# Patient Record
Sex: Female | Born: 1980 | Race: Black or African American | Hispanic: No | Marital: Single | Smoking: Former smoker
Health system: Southern US, Community
[De-identification: ages and names within clinical notes are randomized; demographics above are authoritative.]

## PROBLEM LIST (undated history)

## (undated) DIAGNOSIS — E7800 Pure hypercholesterolemia, unspecified: Secondary | ICD-10-CM

## (undated) DIAGNOSIS — M7742 Metatarsalgia, left foot: Secondary | ICD-10-CM

## (undated) DIAGNOSIS — R42 Dizziness and giddiness: Secondary | ICD-10-CM

## (undated) DIAGNOSIS — I10 Essential (primary) hypertension: Secondary | ICD-10-CM

## (undated) DIAGNOSIS — E876 Hypokalemia: Secondary | ICD-10-CM

## (undated) DIAGNOSIS — R000 Tachycardia, unspecified: Secondary | ICD-10-CM

## (undated) DIAGNOSIS — M7741 Metatarsalgia, right foot: Secondary | ICD-10-CM

## (undated) HISTORY — DX: Essential (primary) hypertension: I10

## (undated) HISTORY — DX: Metatarsalgia, left foot: M77.42

## (undated) HISTORY — DX: Tachycardia, unspecified: R00.0

## (undated) HISTORY — DX: Metatarsalgia, right foot: M77.41

## (undated) HISTORY — DX: Pure hypercholesterolemia, unspecified: E78.00

## (undated) HISTORY — DX: Hypokalemia: E87.6

## (undated) HISTORY — DX: Dizziness and giddiness: R42

---

## 2006-06-26 ENCOUNTER — Ambulatory Visit: Payer: Self-pay | Admitting: Obstetrics & Gynecology

## 2006-06-26 ENCOUNTER — Other Ambulatory Visit: Admission: RE | Admit: 2006-06-26 | Discharge: 2006-06-26 | Payer: Self-pay | Admitting: Obstetrics & Gynecology

## 2006-06-27 ENCOUNTER — Encounter (INDEPENDENT_AMBULATORY_CARE_PROVIDER_SITE_OTHER): Payer: Self-pay | Admitting: Specialist

## 2006-08-28 ENCOUNTER — Ambulatory Visit: Payer: Self-pay | Admitting: Obstetrics & Gynecology

## 2015-11-10 ENCOUNTER — Encounter: Payer: Self-pay | Admitting: Podiatry

## 2015-12-11 DIAGNOSIS — M79672 Pain in left foot: Principal | ICD-10-CM

## 2015-12-11 DIAGNOSIS — M79671 Pain in right foot: Secondary | ICD-10-CM | POA: Insufficient documentation

## 2015-12-11 NOTE — Progress Notes (Signed)
No show

## 2016-01-05 ENCOUNTER — Emergency Department (HOSPITAL_BASED_OUTPATIENT_CLINIC_OR_DEPARTMENT_OTHER): Payer: Medicaid Other

## 2016-01-05 ENCOUNTER — Emergency Department (HOSPITAL_BASED_OUTPATIENT_CLINIC_OR_DEPARTMENT_OTHER)
Admission: EM | Admit: 2016-01-05 | Discharge: 2016-01-05 | Disposition: A | Discharge status: Home / Self Care | Payer: Medicaid Other | Attending: Emergency Medicine | Admitting: Emergency Medicine

## 2016-01-05 ENCOUNTER — Encounter (HOSPITAL_BASED_OUTPATIENT_CLINIC_OR_DEPARTMENT_OTHER): Payer: Self-pay | Admitting: Emergency Medicine

## 2016-01-05 DIAGNOSIS — R51 Headache: Secondary | ICD-10-CM | POA: Diagnosis present

## 2016-01-05 DIAGNOSIS — R42 Dizziness and giddiness: Secondary | ICD-10-CM | POA: Diagnosis not present

## 2016-01-05 DIAGNOSIS — G44219 Episodic tension-type headache, not intractable: Secondary | ICD-10-CM | POA: Insufficient documentation

## 2016-01-05 NOTE — ED Triage Notes (Signed)
Ha and dizziness x 2 weeks, no changes today just continues

## 2016-01-05 NOTE — ED Provider Notes (Signed)
[facility]   CSN: [id number] Arrival date & time: 01/05/16  1702  By signing my name below, I, Phillis HaggisGabriella Gaje, attest that this documentation has been prepared under the direction and in the presence of Felicie Mornavid Korben Carcione, NP-C. Electronically Signed: Phillis HaggisGabriella Gaje, ED Scribe. 01/05/16. 6:33 PM.  History   Chief Complaint Chief Complaint  Patient presents with  . Headache   The history is provided by the patient. No language interpreter was used.  Headache   This is a new problem. The current episode started more than 1 week ago. The problem has not changed since onset.The pain is moderate. The pain does not radiate. Pertinent negatives include no nausea and no vomiting. Treatments tried: Meclizine and Tramadol. The treatment provided mild relief.    HPI Comments: Donna Alexander is a 35 y.o. female who presents to the Emergency Department complaining of an unchanged, intermittent, persistent headache onset two weeks ago. Pt reports associated intermittent dizziness. Pt states that she was recently diagnosed with a sinus infection and vertigo when the pain began. Pt was started on Meclizine for the vertigo. She states that she has been taking the Meclizine and Tramadol mild relief but the symptoms will return. Pt is on Augmentin for her sinus infection. She denies nausea, vomiting, photophobia, numbness, or weakness.   History reviewed. No pertinent past medical history.  Patient Active Problem List   Diagnosis Date Noted  . Foot pain, bilateral 12/11/2015    History reviewed. No pertinent surgical history.  OB History    No data available       Home Medications    Prior to Admission medications   Not on File    Family History History reviewed. No pertinent family history.  Social History Social History  Substance Use Topics  . Smoking status: Never Smoker  . Smokeless tobacco: Never Used  . Alcohol use No     Allergies   Review of patient's  allergies indicates no known allergies.   Review of Systems Review of Systems  Eyes: Negative for photophobia.  Gastrointestinal: Negative for nausea and vomiting.  Neurological: Positive for dizziness and headaches. Negative for weakness and numbness.  All other systems reviewed and are negative.  Physical Exam Updated Vital Signs BP 137/100   Pulse 76   Temp 98.4 F (36.9 C) (Oral)   Resp 18   Ht 5\' 4"  (1.626 m)   Wt 236 lb (107 kg)   SpO2 100%   BMI 40.51 kg/m   Physical Exam  Constitutional: She is oriented to person, place, and time. She appears well-developed and well-nourished.  HENT:  Head: Normocephalic and atraumatic.  Eyes: EOM are normal. Pupils are equal, round, and reactive to light.  Neck: Normal range of motion. Neck supple.  Cardiovascular: Normal rate, regular rhythm and normal heart sounds.  Exam reveals no gallop and no friction rub.   No murmur heard. Pulmonary/Chest: Effort normal and breath sounds normal. She has no wheezes.  Abdominal: Soft. There is no tenderness.  Musculoskeletal: Normal range of motion.  Neurological: She is alert and oriented to person, place, and time. She has normal strength. No cranial nerve deficit. Coordination normal.  Skin: Skin is warm and dry.  Psychiatric: She has a normal mood and affect. Her behavior is normal.  Nursing note and vitals reviewed.    ED Treatments / Results  DIAGNOSTIC STUDIES: Oxygen Saturation is 100% on RA, normal by my interpretation.    COORDINATION OF CARE: 6:30 PM-Discussed treatment  plan which includes CT scan with pt at bedside and pt agreed to plan.    Labs (all labs ordered are listed, but only abnormal results are displayed) Labs Reviewed - No data to display  EKG  EKG Interpretation None       Radiology Ct Head Wo Contrast  Result Date: 01/05/2016 CLINICAL DATA:  35 y/o F; 2 weeks of headache with lightheadedness and dizziness. EXAM: CT HEAD WITHOUT CONTRAST TECHNIQUE:  Contiguous axial images were obtained from the base of the skull through the vertex without intravenous contrast. COMPARISON:  12/24/2015 CT of the head physical acute polyp is (pain FINDINGS: Brain: No evidence of acute infarction, hemorrhage, hydrocephalus, extra-axial collection or mass lesion/mass effect. Vascular: No hyperdense vessel or unexpected calcification. Skull: Normal. Negative for fracture or focal lesion. Sinuses/Orbits: Stable right maxillary sinus mucous retention cyst, otherwise normal. Other: None. IMPRESSION: No acute intracranial abnormality.  Normal CT of the head. Electronically Signed   By: Mitzi Hansen M.D.   On: 01/05/2016 19:05    Procedures Procedures (including critical care time)  Medications Ordered in ED Medications - No data to display   Initial Impression / Assessment and Plan / ED Course  I have reviewed the triage vital signs and the nursing notes.  Pertinent labs & imaging results that were available during my care of the patient were reviewed by me and considered in my medical decision making (see chart for details).  Clinical Course  CT results reviewed and shared with patient.  Presentation is non concerning for [facility], [facility], Meningitis, or temporal arteritis. Pt is afebrile with no focal neuro deficits, nuchal rigidity, or change in vision. Patient to continue with meclizine for dizziness. Pt verbalizes understanding and is agreeable with plan to dc.     Final Clinical Impressions(s) / ED Diagnoses   Final diagnoses:  None  I personally performed the services described in this documentation, which was scribed in my presence. The recorded information has been reviewed and is accurate.   New Prescriptions New Prescriptions   No medications on file     Felicie Morn, NP 01/06/16 1610    Rolan Bucco, MD 01/07/16 0700

## 2016-01-05 NOTE — Discharge Instructions (Signed)
Continue with the meclizine as directed.  Please complete your antibiotic.  Follow-up with your primary care provider.

## 2017-07-13 IMAGING — CT CT HEAD W/O CM
3 series · 15 of 47 positions shown, 18 images · non-contrast
Comparison: 12/24/2015 CT of the head physical acute polyp is (pain

CLINICAL DATA: 35 y/o F; 2 weeks of headache with lightheadedness
and dizziness.

EXAM:
CT HEAD WITHOUT CONTRAST
TECHNIQUE: Contiguous axial images were obtained from the base of the skull
through the vertex without intravenous contrast.

[Series 2: head wo · axial · 0.43mm/px · z∈[-179,-54]mm · 9 of 31 slices shown, 12 images]
[im 3/31  brain]
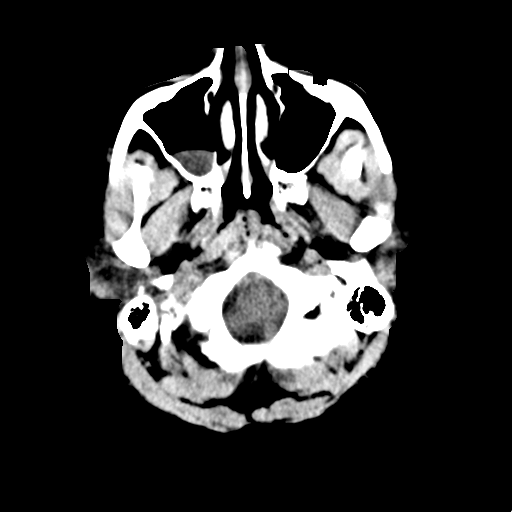
[im 3/31  bone]
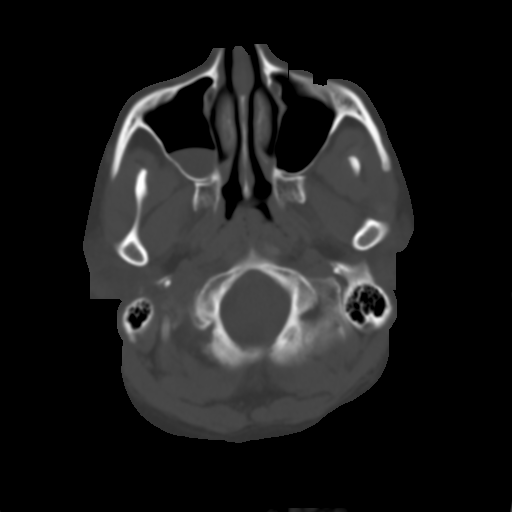
[im 6/31  brain]
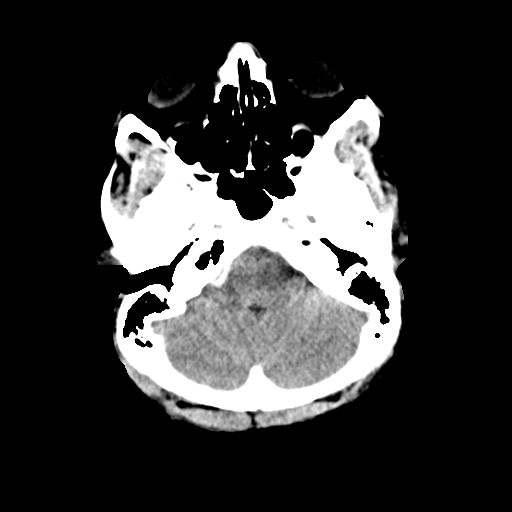
[im 9/31  brain]
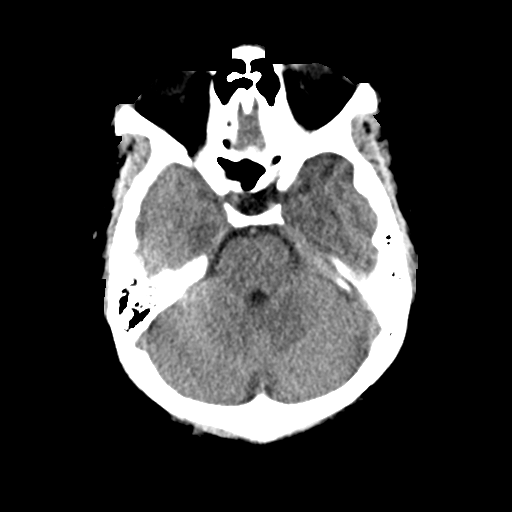
[im 12/31  brain]
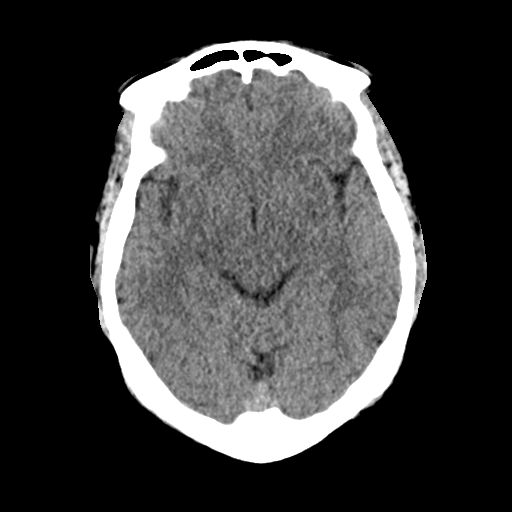
[im 16/31  brain]
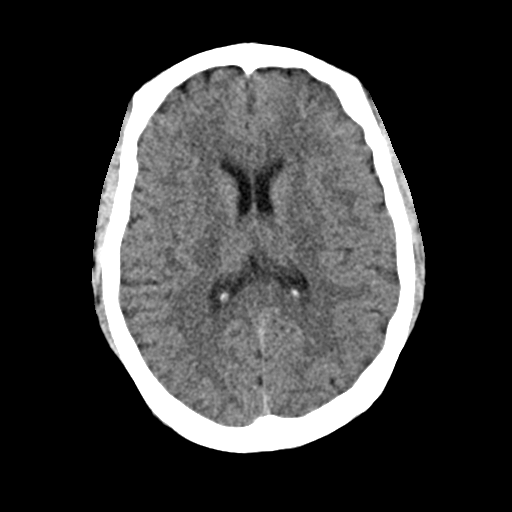
[im 16/31  bone]
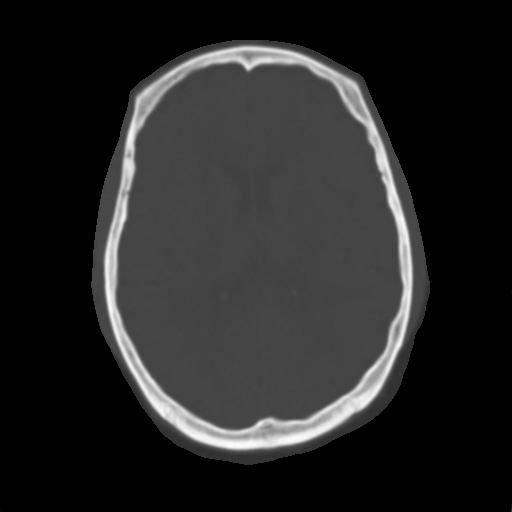
[im 19/31  brain]
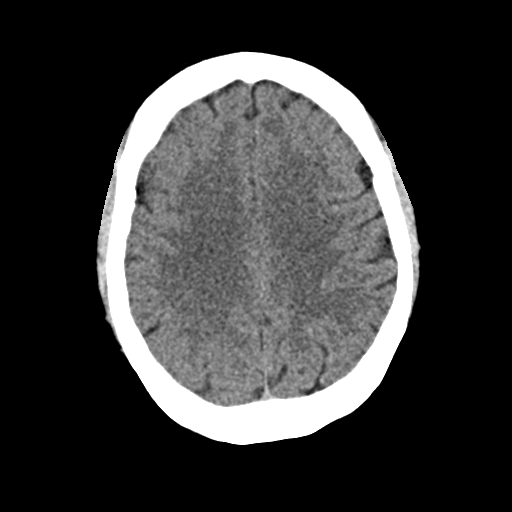
[im 22/31  brain]
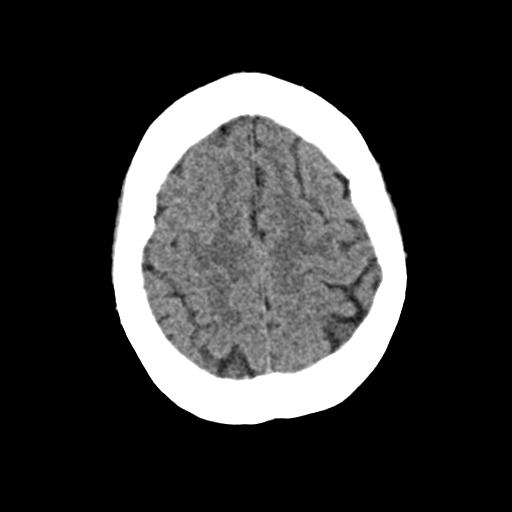
[im 25/31  brain]
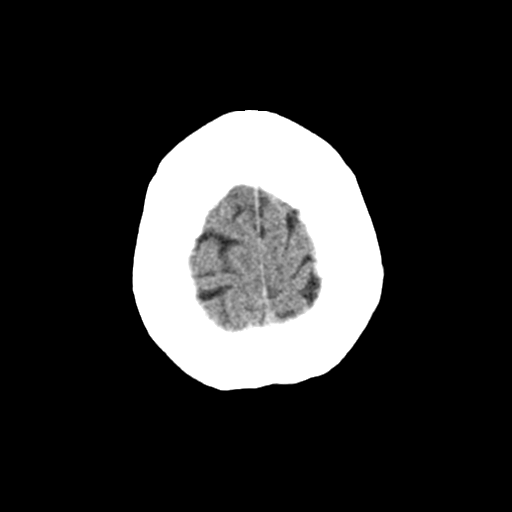
[im 28/31  brain]
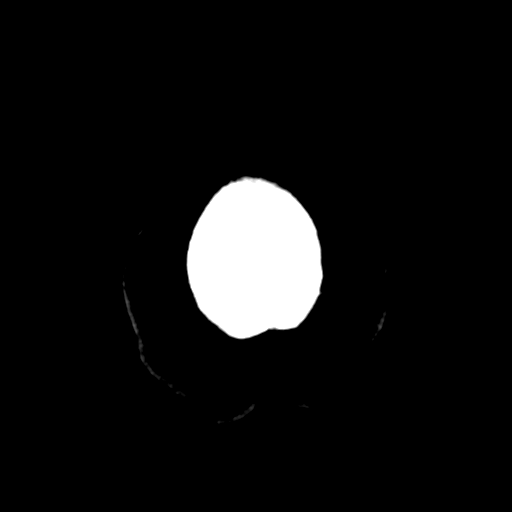
[im 28/31  bone]
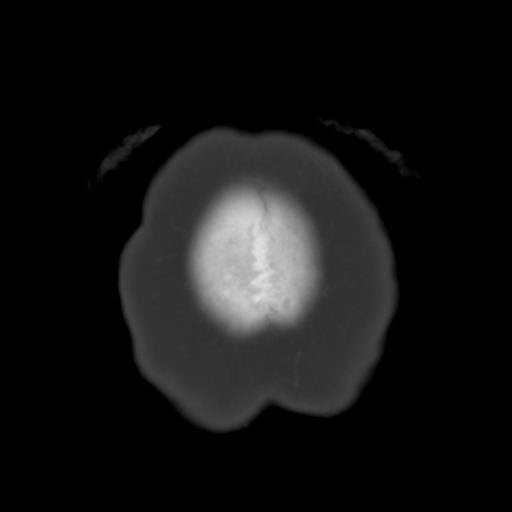

[Series 4: coronal soft · coronal · 0.31mm/px · 3 of 63 slices shown]
[im 21/63  brain]
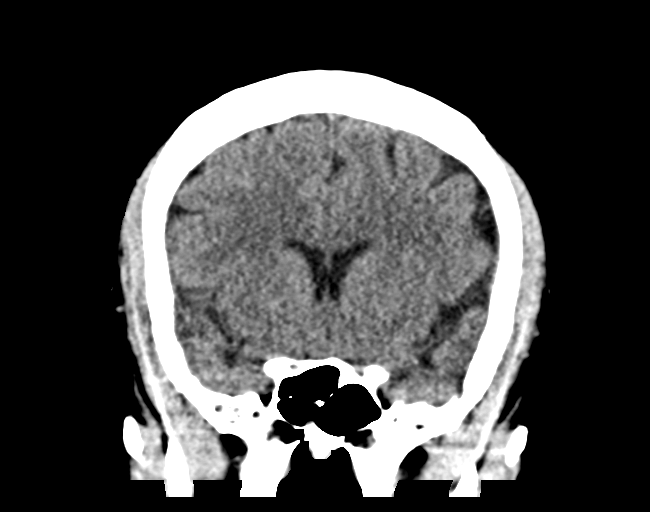
[im 28/63  brain]
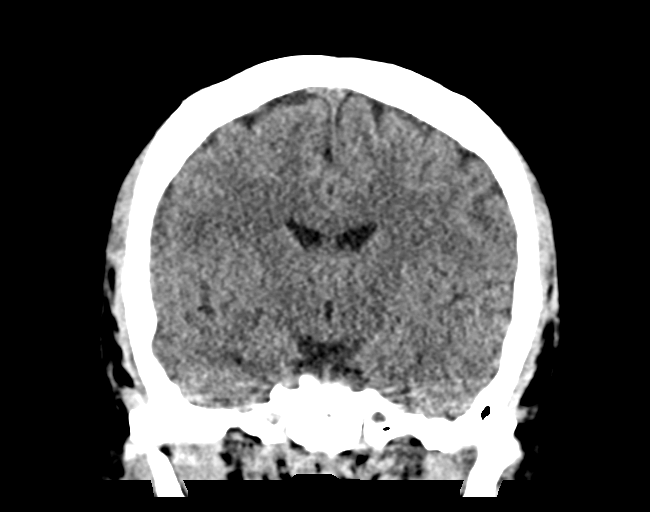
[im 35/63  brain]
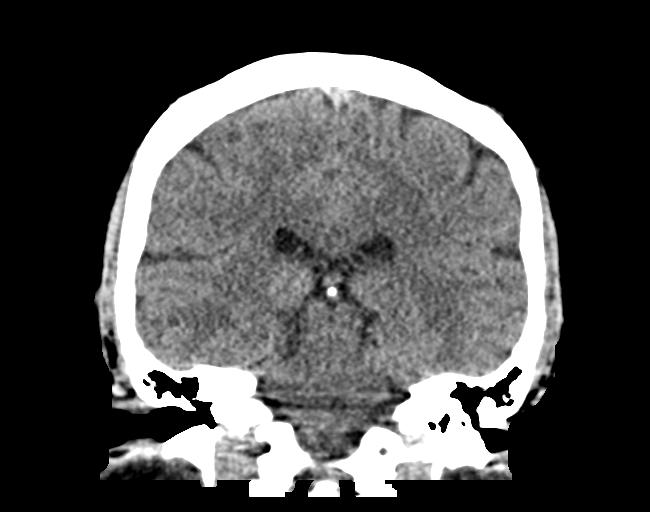

[Series 5: sag soft · sagittal · 0.29mm/px · 3 of 67 slices shown]
[im 23/67  brain]
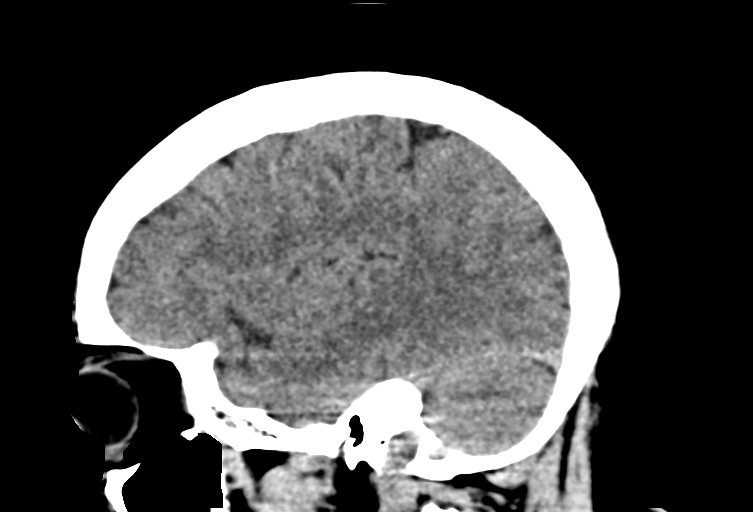
[im 34/67  brain]
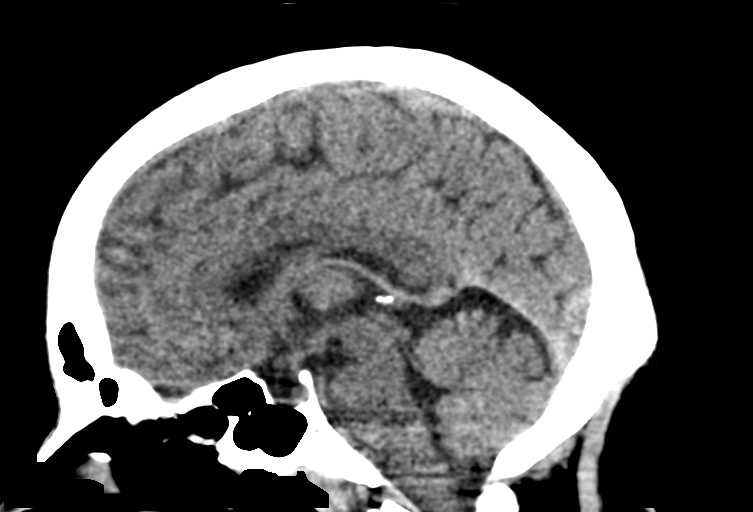
[im 45/67  brain]
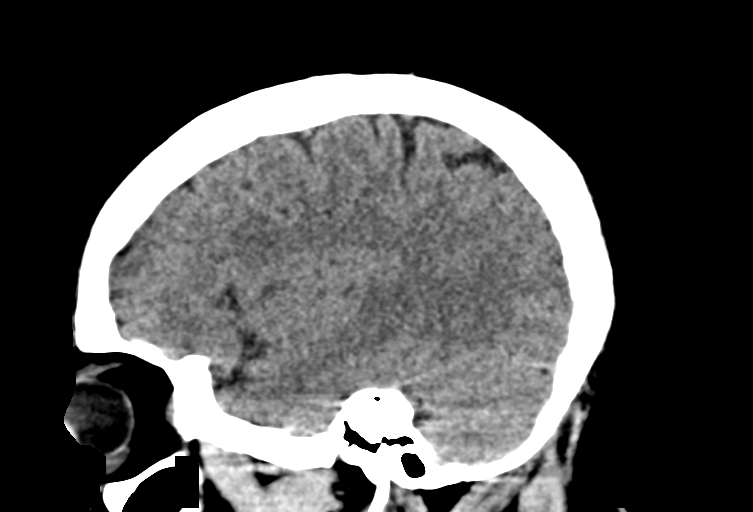

[15 of 47 positions shown; findings below may reference images not displayed]

FINDINGS: Brain: No evidence of acute infarction, hemorrhage, hydrocephalus,
extra-axial collection or mass lesion/mass effect.

Vascular: No hyperdense vessel or unexpected calcification.

Skull: Normal. Negative for fracture or focal lesion.

Sinuses/Orbits: Stable right maxillary sinus mucous retention cyst,
otherwise normal.

Other: None.
IMPRESSION: No acute intracranial abnormality.  Normal CT of the head.

By: Ervista Ceola M.D.

## 2018-03-16 ENCOUNTER — Encounter: Payer: Self-pay | Admitting: *Deleted

## 2018-03-17 ENCOUNTER — Encounter: Payer: Self-pay | Admitting: Diagnostic Neuroimaging

## 2018-03-17 ENCOUNTER — Ambulatory Visit: Payer: Medicaid Other | Admitting: Diagnostic Neuroimaging

## 2018-03-17 VITALS — BP 164/104 | HR 70 | Ht 64.0 in | Wt 218.8 lb

## 2018-03-17 DIAGNOSIS — R42 Dizziness and giddiness: Secondary | ICD-10-CM | POA: Diagnosis not present

## 2018-03-17 DIAGNOSIS — G43109 Migraine with aura, not intractable, without status migrainosus: Secondary | ICD-10-CM | POA: Diagnosis not present

## 2018-03-17 MED ORDER — RIZATRIPTAN BENZOATE 10 MG PO TBDP: 10.0000 mg | ORAL_TABLET | ORAL | 11 refills | Status: DC | PRN

## 2018-03-17 MED ORDER — RIZATRIPTAN BENZOATE 10 MG PO TBDP: 10.0000 mg | ORAL_TABLET | ORAL | 11 refills | Status: AC | PRN

## 2018-03-17 NOTE — Patient Instructions (Signed)
  MIGRAINE WITH AURA  - gradually increase propranolol up to 40mg  twice a day (per PCP)  - trial of rizatriptan 10mg  as needed for breakthrough headache; may repeat x 1 after 2 hours; max 2 tabs per day or 8 per month  - check MRI brain  - check labs

## 2018-03-17 NOTE — Progress Notes (Signed)
[facility]  PATIENT: Donna Alexander DOB: June 30, 1980  REFERRING CLINICIAN: R Everly HISTORY FROM: patient and chart review REASON FOR VISIT: new consult    HISTORICAL  CHIEF COMPLAINT:  Chief Complaint  Patient presents with  . New Patient (Initial Visit)    Rm 7, alone  . Referred by Dr. Hope Pigeonebecca Everly    Dizziness/ Headaches. Going on for several months.  when gets out of bed, notes dizziness,  takes ES tylenol prn    HISTORY OF PRESENT ILLNESS:   37 year old female here for evaluation of dizziness and headache.  Patient reports new onset headache and dizziness only starting 1 month ago.  However reviewed with the chart indicates that patient has been struggling with these headaches and dizziness problems since 2017.  In addition patient has had migraine headaches from age 37 to 37 years old with right-sided throbbing sensation, mild nausea, blurred vision.  The past 1 month patient having right-sided headache with throbbing sensation, mild nausea, seeing spots, lasting 1 hour at a time.  Patient also having almost daily "dizzy" sensation which she describes as an off-balance feeling.  Sometimes she feels staggering sensation.  No spinning sensation or lightheadedness.  Since July 2019 patient also has been having trouble with tolerating hypertension and has tried 7 different blood pressure medications, but could not tolerate these due to very side effects.  Currently patient is on propranolol 10 mg twice a day and also notes some decreased energy and decreased appetite sensation.  Patient has had CT scan head which was unremarkable.  Here for further evaluation.    REVIEW OF SYSTEMS: Full 14 system review of systems performed and negative with exception of: Headache numbness weakness dizziness change in appetite cough chest pain hypertension hyperglycemia anxiety.  ALLERGIES: No Known Allergies  HOME MEDICATIONS: Outpatient Medications Prior to Visit    Medication Sig Dispense Refill  . acetaminophen (TYLENOL) 500 MG tablet Take 500 mg by mouth every 6 (six) hours as needed.    . potassium chloride SA (K-DUR,KLOR-CON) 20 MEQ tablet Take 20 mEq by mouth daily.  0  . propranolol (INDERAL) 10 MG tablet Take 10 mg by mouth 2 (two) times daily.  3  . rosuvastatin (CRESTOR) 40 MG tablet   0   No facility-administered medications prior to visit.     PAST MEDICAL HISTORY: Past Medical History:  Diagnosis Date  . Dizziness   . Hypercholesterolemia   . Hypertension   . Hypokalemia   . Metatarsalgia of both feet   . Sinus tachycardia     PAST SURGICAL HISTORY: No past surgical history on file.  FAMILY HISTORY: Family History  Problem Relation Age of Onset  . Cancer Mother        breast  . Hypertension Mother   . Hypertension Maternal Grandmother   . Other Father        accident (shot)    SOCIAL HISTORY: Social History   Socioeconomic History  . Marital status: Single    Spouse name: Not on file  . Number of children: Not on file  . Years of education: Not on file  . Highest education level: Not on file  Occupational History  . Not on file  Social Needs  . Financial resource strain: Not on file  . Food insecurity:    Worry: Not on file    Inability: Not on file  . Transportation needs:    Medical: Not on file    Non-medical: Not on file  Tobacco Use  . Smoking status: Former Smoker    Packs/day: 0.25    Last attempt to quit: 01/27/2018    Years since quitting: 0.1  . Smokeless tobacco: Never Used  Substance and Sexual Activity  . Alcohol use: No  . Drug use: Never  . Sexual activity: Not on file  Lifestyle  . Physical activity:    Days per week: Not on file    Minutes per session: Not on file  . Stress: Not on file  Relationships  . Social connections:    Talks on phone: Not on file    Gets together: Not on file    Attends religious service: Not on file    Active member of club or organization: Not on  file    Attends meetings of clubs or organizations: Not on file    Relationship status: Not on file  . Intimate partner violence:    Fear of current or ex partner: Not on file    Emotionally abused: Not on file    Physically abused: Not on file    Forced sexual activity: Not on file  Other Topics Concern  . Not on file  Social History Narrative   Lives with son, (16yo). Single.   Does not work.  Education 11th grade.  Children one.  Caffeine Mtn Dew/ Pepsi  Occasional.      PHYSICAL EXAM  GENERAL EXAM/CONSTITUTIONAL: Vitals:  Vitals:   03/17/18 1419  BP: (!) 164/104  Pulse: 70  Weight: 218 lb 12.8 oz (99.2 kg)  Height: 5\' 4"  (1.626 m)     Body mass index is 37.56 kg/m. Wt Readings from Last 3 Encounters:  03/17/18 218 lb 12.8 oz (99.2 kg)  01/05/16 236 lb (107 kg)     Patient is in no distress; well developed, nourished and groomed; neck is supple  CARDIOVASCULAR:  Examination of carotid arteries is normal; no carotid bruits  Regular rate and rhythm, no murmurs  Examination of peripheral vascular system by observation and palpation is normal  EYES:  Ophthalmoscopic exam of optic discs and posterior segments is normal; no papilledema or hemorrhages  BILATERAL PROPTOSIS    Visual Acuity Screening   Right eye Left eye Both eyes  Without correction: 20/100 20/70    With correction:        MUSCULOSKELETAL:  Gait, strength, tone, movements noted in Neurologic exam below  NEUROLOGIC: MENTAL STATUS:  No flowsheet data found.  awake, alert, oriented to person, place and time  recent and remote memory intact  normal attention and concentration  language fluent, comprehension intact, naming intact  fund of knowledge appropriate  CRANIAL NERVE:   2nd - no papilledema on fundoscopic exam  2nd, 3rd, 4th, 6th - pupils equal and reactive to light, visual fields full to confrontation, extraocular muscles intact, no nystagmus  5th - facial sensation  symmetric  7th - facial strength symmetric  8th - hearing intact  9th - palate elevates symmetrically, uvula midline  11th - shoulder shrug symmetric  12th - tongue protrusion midline  MOTOR:   normal bulk and tone, full strength in the BUE, BLE  SENSORY:   normal and symmetric to light touch, temperature, vibration  COORDINATION:   finger-nose-finger, fine finger movements normal  REFLEXES:   deep tendon reflexes present and symmetric  GAIT/STATION:   narrow based gait; able to walk tandem    DIAGNOSTIC DATA (LABS, IMAGING, TESTING) - I reviewed patient records, labs, notes, testing and imaging myself where available.  No results found for: WBC, HGB, HCT, MCV, PLT No results found for: NA, K, CL, CO2, GLUCOSE, BUN, CREATININE, CALCIUM, PROT, ALBUMIN, AST, ALT, ALKPHOS, BILITOT, GFRNONAA, GFRAA No results found for: CHOL, HDL, LDLCALC, LDLDIRECT, TRIG, CHOLHDL No results found for: ZOXW9U No results found for: VITAMINB12 No results found for: TSH   01/05/16 CT head [I reviewed images myself and agree with interpretation. -VRP]  - No acute intracranial abnormality.  Normal CT of the head.  02/19/18 [REPORT ONLY] - Normal CT head without contrast. No change from prior imaging.   ASSESSMENT AND PLAN  37 y.o. year old female here with headaches and dizziness.  Dx:  1. Dizziness   2. Migraine with aura and without status migrainosus, not intractable     PLAN:  MIGRAINE WITH AURA  - gradually increase propranolol up to 40mg  twice a day (per PCP) - trial of rizatriptan 10mg  as needed for breakthrough headache; may repeat x 1 after 2 hours; max 2 tabs per day or 8 per month - check MRI brain w/wo (rule out other causes)  FATIGUE / MALAISE / DECR APPETITE - check a1c, b12 (fatigue, malaise eval)  ACCELERATED HYPERTENSION - follow up per PCP  Orders Placed This Encounter  Procedures  . MR BRAIN W WO CONTRAST  . Vitamin B12  . Hemoglobin A1c    Meds ordered this encounter  Medications  . rizatriptan (MAXALT-MLT) 10 MG disintegrating tablet    Sig: Take 1 tablet (10 mg total) by mouth as needed for migraine. May repeat in 2 hours if needed    Dispense:  9 tablet    Refill:  11   Return in about 6 months (around 09/15/2018).    Suanne Marker, MD 03/17/2018, 2:52 PM Certified in Neurology, Neurophysiology and Neuroimaging  [facility] [address] [place], [place] [address] [phone]

## 2018-03-18 ENCOUNTER — Telehealth: Payer: Self-pay | Admitting: Diagnostic Neuroimaging

## 2018-03-18 LAB — HEMOGLOBIN A1C
ESTIMATED AVERAGE GLUCOSE: 120 mg/dL
HEMOGLOBIN A1C: 5.8 % — AB (ref 4.8–5.6)

## 2018-03-18 LAB — VITAMIN B12: Vitamin B-12: 792 pg/mL (ref 232–1245)

## 2018-03-18 NOTE — Telephone Encounter (Signed)
Medicaid order sent to [facility]. They obtain the auth and will reach out to the pt to schedule.  °

## 2018-03-18 NOTE — Telephone Encounter (Signed)
Patient is aware and to call GI if she has not heard in the next 2-3 business days at [phone].

## 2018-03-19 ENCOUNTER — Telehealth: Payer: Self-pay | Admitting: *Deleted

## 2018-03-19 NOTE — Telephone Encounter (Signed)
Relayed lab results to pt. She verbalized understanding.   Fax confirmation of results to her pcp, Dr. Patsey BertholdEverly.

## 2018-03-19 NOTE — Telephone Encounter (Signed)
-----   Message from Vikram R Penumalli, MD sent at 03/19/2018 11:43 AM EST ----- Unremarkable labs. Continue current plan. Please call patient. -VRP 

## 2018-03-19 NOTE — Telephone Encounter (Signed)
Spoke to pt and relayed that her lab results were unremarkable.  She had normal B12 and her HbgA1C was 5.8 prediabetic range.   Just for her to be aware.  Eat healthy, watch sugar intake, exercise/ weight bearing exercises.  She verbalized understanding.  I will forward to Dr. Micael HampshireEverly [facility] in [place].

## 2018-03-23 ENCOUNTER — Other Ambulatory Visit: Payer: Self-pay | Admitting: Diagnostic Neuroimaging

## 2018-03-25 ENCOUNTER — Telehealth: Payer: Self-pay | Admitting: Diagnostic Neuroimaging

## 2018-03-25 NOTE — Telephone Encounter (Signed)
[facility] called and stated medicaid denied the MRI, please advise on how to proceed. Apt will be canceled by [facility]. DW

## 2018-03-29 ENCOUNTER — Inpatient Hospital Stay: Admission: RE | Admit: 2018-03-29 | Payer: Self-pay | Source: Ambulatory Visit

## 2018-03-30 NOTE — Telephone Encounter (Signed)
I called [employer] and spoke to Donna Alexander. The reason it was denied was because the exam is similar to the CT head that she had done on 02/19/18. There is an option for a peer to peer the phone number for that is [phone] and the case number is [id number]. The deadline for the peer to peer is Friday 04/03/18 but keep in mind it has to be scheduled to do the peer to peer.

## 2018-04-02 NOTE — Telephone Encounter (Addendum)
MRI brain denied due to being a similar exam of ct head done on 02/19/18. Called [employer] to schedule peer to peer. Spoke with Lorene Dyhristie and scheduled peer to peer on  12/6//19, 9:30 am with Dr Leveda AnnaBunnie Richie. Lorene DyChristie advised that the call may come from either 800 or 615 area code. This RN gave her office # as first contact and Dr Richrd HumblesPenumalli's # as second contact. Routed this information to Dr Marjory LiesPenumalli.

## 2018-04-03 ENCOUNTER — Telehealth: Payer: Self-pay | Admitting: Diagnostic Neuroimaging

## 2018-04-03 DIAGNOSIS — H0520 Unspecified exophthalmos: Secondary | ICD-10-CM

## 2018-04-03 NOTE — Telephone Encounter (Signed)
MRI brain w/wo approved.  Also MRI orbits w/wo approved. Will add MRI orbits order. auth ID valid for both studies.   auth ID: [id number]  Orders Placed This Encounter  Procedures  . MR ORBITS W WO CONTRAST   Suanne MarkerVIKRAM R. Sister Carbone, MD 04/03/2018, 9:44 AM Certified in Neurology, Neurophysiology and Neuroimaging  [facility] [address], [address] [place], [place] [address] [phone]

## 2018-04-03 NOTE — Telephone Encounter (Signed)
Completed. -VRP

## 2018-04-03 NOTE — Telephone Encounter (Signed)
Noted, thank you will send order to GI.

## 2018-04-20 ENCOUNTER — Other Ambulatory Visit: Payer: Medicaid Other

## 2021-12-19 ENCOUNTER — Ambulatory Visit: Payer: Medicaid Other | Admitting: Student

## 2022-01-08 ENCOUNTER — Ambulatory Visit: Payer: Medicaid Other | Admitting: Student
